# Patient Record
Sex: Female | Born: 1975 | Race: White | Hispanic: No | Marital: Married | Smoking: Former smoker
Health system: Southern US, Community
[De-identification: ages and names within clinical notes are randomized; demographics above are authoritative.]

## PROBLEM LIST (undated history)

## (undated) DIAGNOSIS — O09529 Supervision of elderly multigravida, unspecified trimester: Secondary | ICD-10-CM

## (undated) DIAGNOSIS — I499 Cardiac arrhythmia, unspecified: Secondary | ICD-10-CM

## (undated) DIAGNOSIS — IMO0002 Reserved for concepts with insufficient information to code with codable children: Secondary | ICD-10-CM

## (undated) DIAGNOSIS — F419 Anxiety disorder, unspecified: Secondary | ICD-10-CM

## (undated) DIAGNOSIS — R51 Headache: Secondary | ICD-10-CM

## (undated) HISTORY — PX: REDUCTION MAMMAPLASTY: SUR839

## (undated) HISTORY — PX: DILATION AND CURETTAGE OF UTERUS: SHX78

## (undated) HISTORY — PX: KNEE SURGERY: SHX244

## (undated) HISTORY — PX: WISDOM TOOTH EXTRACTION: SHX21

## (undated) HISTORY — PX: AUGMENTATION MAMMAPLASTY: SUR837

## (undated) HISTORY — DX: Reserved for concepts with insufficient information to code with codable children: IMO0002

## (undated) HISTORY — DX: Anxiety disorder, unspecified: F41.9

## (undated) HISTORY — DX: Headache: R51

## (undated) HISTORY — DX: Supervision of elderly multigravida, unspecified trimester: O09.529

## (undated) HISTORY — DX: Cardiac arrhythmia, unspecified: I49.9

---

## 2011-10-21 NOTE — L&D Delivery Note (Signed)
Delivery Note Pt reached complete dilation and pushed great and at 1:37 PM a healthy female was delivered via  (Presentation: LOA).  APGAR: 8,9 weight pending.   Placenta status:delivered spontaneously, fundal check confirmed no fragments, .  Cord:  with the following complications: none.    Anesthesia:  epidural Episiotomy: none Lacerations: first degree Suture Repair: 3.0 vicryl rapide Est. Blood Loss (mL): 350cc  Mom to postpartum.  Baby to stay in room with mother.  Sarah Lucas 06/09/2012, 1:58 PM

## 2011-11-24 LAB — OB RESULTS CONSOLE ANTIBODY SCREEN: Antibody Screen: NEGATIVE

## 2011-11-24 LAB — OB RESULTS CONSOLE ABO/RH

## 2011-11-24 LAB — OB RESULTS CONSOLE GC/CHLAMYDIA
Chlamydia: NEGATIVE
Gonorrhea: NEGATIVE

## 2012-05-17 LAB — OB RESULTS CONSOLE GBS: GBS: NEGATIVE

## 2012-05-28 ENCOUNTER — Encounter (HOSPITAL_COMMUNITY): Payer: Self-pay | Admitting: *Deleted

## 2012-05-28 ENCOUNTER — Telehealth (HOSPITAL_COMMUNITY): Payer: Self-pay | Admitting: *Deleted

## 2012-05-28 NOTE — Telephone Encounter (Signed)
Preadmission screen  

## 2012-06-08 NOTE — H&P (Signed)
Sarah Lucas is a 36 y.o. female G3P1011 at 39+ weeks (EDD 06/12/12 by LMP c/w 9 week [place]) presenting for IOL at term with favorable cervix.  Prenatal care uneventful.  H/o pp hemorrhage with last delivery.  Maternal Medical History:  Contractions: Frequency: irregular.   Perceived severity is mild.    Fetal activity: Perceived fetal activity is normal.      OB History    Grav Para Term Preterm Abortions TAB SAB Ect Mult Living   3 1 1  1 1    1     EAB x 1 1994 NSVD 38 weeks 8#9oz 2009  Past Medical History  Diagnosis Date  . Anxiety   . Degenerative disc disease   . Dysrhythmia   . Headache     migraines  . AMA (advanced maternal age) multigravida 35+    Past Surgical History  Procedure Date  . Knee surgery   . Wisdom tooth extraction   . Dilation and curettage of uterus    Family History: family history includes Cancer in her paternal grandfather and paternal grandmother; Diabetes in her mother; Diverticulitis in her mother; Heart attack in her mother and paternal grandfather; Heart disease in her mother; Hypertension in her father; and Migraines in her mother. Social History:  reports that she quit smoking about 11 years ago. She has never used smokeless tobacco. She reports that she does not drink alcohol or use illicit drugs.   Prenatal Transfer Tool  Maternal Diabetes: No Genetic Screening: Normal Maternal Ultrasounds/Referrals: Normal Fetal Ultrasounds or other Referrals:  None Maternal Substance Abuse:  No Significant Maternal Medications:  None Significant Maternal Lab Results:  None Other Comments:  None  ROS    Last menstrual period 09/06/2011. Maternal Exam:  Uterine Assessment: Contraction strength is mild.  Contraction frequency is irregular.   Abdomen: Patient reports no abdominal tenderness. Fetal presentation: vertex  Introitus: Normal vulva. Normal vagina.    Physical Exam  Constitutional: She is oriented to person, place, and time. She  appears well-developed and well-nourished.  Cardiovascular: Normal rate and regular rhythm.   Respiratory: Effort normal and breath sounds normal.  GI: Soft. Bowel sounds are normal.  Genitourinary: Vagina normal and uterus normal.  Neurological: She is alert and oriented to person, place, and time.  Psychiatric: She has a normal mood and affect. Her behavior is normal.    Prenatal labs: ABO, Rh: O/Positive/-- (02/04 0000) Antibody: Negative (02/04 0000) Rubella: Immune (02/04 0000) RPR: Nonreactive (02/04 0000)  HBsAg: Negative (02/04 0000)  HIV: Non-reactive (02/04 0000)  GBS: Negative (07/29 0000)  One hour GTT 79 First trimester screen WNL AFP WNL Assessment/Plan: IOL at term, plan AROM and pitocin  [name] W 06/08/2012, 5:42 PM

## 2012-06-09 ENCOUNTER — Encounter (HOSPITAL_COMMUNITY): Payer: Self-pay

## 2012-06-09 ENCOUNTER — Encounter (HOSPITAL_COMMUNITY): Payer: Self-pay | Admitting: Anesthesiology

## 2012-06-09 ENCOUNTER — Inpatient Hospital Stay (HOSPITAL_COMMUNITY): Payer: BC Managed Care – PPO | Admitting: Anesthesiology

## 2012-06-09 ENCOUNTER — Inpatient Hospital Stay (HOSPITAL_COMMUNITY)
Admission: RE | Admit: 2012-06-09 | Discharge: 2012-06-11 | DRG: 373 | Disposition: A | Discharge status: Home / Self Care | Payer: BC Managed Care – PPO | Source: Ambulatory Visit | Attending: Obstetrics and Gynecology | Admitting: Obstetrics and Gynecology

## 2012-06-09 DIAGNOSIS — O09529 Supervision of elderly multigravida, unspecified trimester: Secondary | ICD-10-CM | POA: Diagnosis present

## 2012-06-09 LAB — CBC
HCT: 39.3 % (ref 36.0–46.0)
MCV: 94.2 fL (ref 78.0–100.0)
RBC: 4.17 MIL/uL (ref 3.87–5.11)
RDW: 13.9 % (ref 11.5–15.5)
WBC: 8.7 10*3/uL (ref 4.0–10.5)

## 2012-06-09 LAB — PREPARE RBC (CROSSMATCH)

## 2012-06-09 LAB — ABO/RH: ABO/RH(D): O POS

## 2012-06-09 MED ORDER — PHENYLEPHRINE 40 MCG/ML (10ML) SYRINGE FOR IV PUSH (FOR BLOOD PRESSURE SUPPORT)
80.0000 ug | PREFILLED_SYRINGE | INTRAVENOUS | Status: DC | PRN
  Filled 2012-06-09: qty 5

## 2012-06-09 MED ORDER — ACETAMINOPHEN 325 MG PO TABS: 650.0000 mg | ORAL_TABLET | ORAL | Status: DC | PRN

## 2012-06-09 MED ORDER — PHENYLEPHRINE 40 MCG/ML (10ML) SYRINGE FOR IV PUSH (FOR BLOOD PRESSURE SUPPORT): 80.0000 ug | PREFILLED_SYRINGE | INTRAVENOUS | Status: DC | PRN

## 2012-06-09 MED ORDER — FLEET ENEMA 7-19 GM/118ML RE ENEM: 1.0000 | ENEMA | RECTAL | Status: DC | PRN

## 2012-06-09 MED ORDER — ONDANSETRON HCL 4 MG PO TABS: 4.0000 mg | ORAL_TABLET | ORAL | Status: DC | PRN

## 2012-06-09 MED ORDER — PRENATAL MULTIVITAMIN CH
1.0000 | ORAL_TABLET | Freq: Every day | ORAL | Status: DC
  Administered 2012-06-10 – 2012-06-11 (×2): 1 via ORAL
  Filled 2012-06-09 (×2): qty 1

## 2012-06-09 MED ORDER — CITRIC ACID-SODIUM CITRATE 334-500 MG/5ML PO SOLN: 30.0000 mL | ORAL | Status: DC | PRN

## 2012-06-09 MED ORDER — ZOLPIDEM TARTRATE 5 MG PO TABS: 5.0000 mg | ORAL_TABLET | Freq: Every evening | ORAL | Status: DC | PRN

## 2012-06-09 MED ORDER — SENNOSIDES-DOCUSATE SODIUM 8.6-50 MG PO TABS
2.0000 | ORAL_TABLET | Freq: Every day | ORAL | Status: DC
  Administered 2012-06-09 – 2012-06-10 (×2): 2 via ORAL

## 2012-06-09 MED ORDER — LACTATED RINGERS IV SOLN: 500.0000 mL | Freq: Once | INTRAVENOUS | Status: DC

## 2012-06-09 MED ORDER — DIPHENHYDRAMINE HCL 50 MG/ML IJ SOLN: 12.5000 mg | INTRAMUSCULAR | Status: DC | PRN

## 2012-06-09 MED ORDER — OXYCODONE-ACETAMINOPHEN 5-325 MG PO TABS
1.0000 | ORAL_TABLET | ORAL | Status: DC | PRN
  Administered 2012-06-10 (×3): 1 via ORAL
  Filled 2012-06-09 (×3): qty 1

## 2012-06-09 MED ORDER — TERBUTALINE SULFATE 1 MG/ML IJ SOLN: 0.2500 mg | Freq: Once | INTRAMUSCULAR | Status: DC | PRN

## 2012-06-09 MED ORDER — LIDOCAINE HCL (PF) 1 % IJ SOLN
30.0000 mL | INTRAMUSCULAR | Status: DC | PRN
  Filled 2012-06-09: qty 30

## 2012-06-09 MED ORDER — EPHEDRINE 5 MG/ML INJ: 10.0000 mg | INTRAVENOUS | Status: DC | PRN

## 2012-06-09 MED ORDER — EPHEDRINE 5 MG/ML INJ
10.0000 mg | INTRAVENOUS | Status: DC | PRN
  Filled 2012-06-09: qty 4

## 2012-06-09 MED ORDER — OXYTOCIN 40 UNITS IN LACTATED RINGERS INFUSION - SIMPLE MED: 62.5000 mL/h | Freq: Once | INTRAVENOUS | Status: DC

## 2012-06-09 MED ORDER — DIPHENHYDRAMINE HCL 25 MG PO CAPS: 25.0000 mg | ORAL_CAPSULE | Freq: Four times a day (QID) | ORAL | Status: DC | PRN

## 2012-06-09 MED ORDER — ONDANSETRON HCL 4 MG/2ML IJ SOLN: 4.0000 mg | Freq: Four times a day (QID) | INTRAMUSCULAR | Status: DC | PRN

## 2012-06-09 MED ORDER — OXYTOCIN 40 UNITS IN LACTATED RINGERS INFUSION - SIMPLE MED
1.0000 m[IU]/min | INTRAVENOUS | Status: DC
  Administered 2012-06-09: 2 m[IU]/min via INTRAVENOUS
  Filled 2012-06-09: qty 1000

## 2012-06-09 MED ORDER — IBUPROFEN 600 MG PO TABS
600.0000 mg | ORAL_TABLET | Freq: Four times a day (QID) | ORAL | Status: DC
  Administered 2012-06-09 – 2012-06-11 (×7): 600 mg via ORAL
  Filled 2012-06-09 (×7): qty 1

## 2012-06-09 MED ORDER — BENZOCAINE-MENTHOL 20-0.5 % EX AERO
1.0000 "application " | INHALATION_SPRAY | CUTANEOUS | Status: DC | PRN
  Administered 2012-06-09: 1 via TOPICAL
  Filled 2012-06-09: qty 56

## 2012-06-09 MED ORDER — FENTANYL 2.5 MCG/ML BUPIVACAINE 1/10 % EPIDURAL INFUSION (WH - ANES)
14.0000 mL/h | INTRAMUSCULAR | Status: DC
  Administered 2012-06-09: 14 mL/h via EPIDURAL
  Filled 2012-06-09 (×2): qty 60

## 2012-06-09 MED ORDER — SODIUM BICARBONATE 8.4 % IV SOLN
INTRAVENOUS | Status: DC | PRN
  Administered 2012-06-09: 5 mL via EPIDURAL

## 2012-06-09 MED ORDER — OXYTOCIN BOLUS FROM INFUSION
250.0000 mL | Freq: Once | INTRAVENOUS | Status: DC
  Filled 2012-06-09: qty 500

## 2012-06-09 MED ORDER — IBUPROFEN 600 MG PO TABS: 600.0000 mg | ORAL_TABLET | Freq: Four times a day (QID) | ORAL | Status: DC | PRN

## 2012-06-09 MED ORDER — DIBUCAINE 1 % RE OINT: 1.0000 "application " | TOPICAL_OINTMENT | RECTAL | Status: DC | PRN

## 2012-06-09 MED ORDER — FENTANYL 2.5 MCG/ML BUPIVACAINE 1/10 % EPIDURAL INFUSION (WH - ANES)
INTRAMUSCULAR | Status: DC | PRN
  Administered 2012-06-09: 15 mL/h via EPIDURAL

## 2012-06-09 MED ORDER — LANOLIN HYDROUS EX OINT: TOPICAL_OINTMENT | CUTANEOUS | Status: DC | PRN

## 2012-06-09 MED ORDER — LACTATED RINGERS IV SOLN
INTRAVENOUS | Status: DC
  Administered 2012-06-09: 12:00:00 via INTRAVENOUS

## 2012-06-09 MED ORDER — LACTATED RINGERS IV SOLN
500.0000 mL | INTRAVENOUS | Status: DC | PRN
  Administered 2012-06-09: 500 mL via INTRAVENOUS

## 2012-06-09 MED ORDER — TETANUS-DIPHTH-ACELL PERTUSSIS 5-2.5-18.5 LF-MCG/0.5 IM SUSP: 0.5000 mL | Freq: Once | INTRAMUSCULAR | Status: DC

## 2012-06-09 MED ORDER — OXYCODONE-ACETAMINOPHEN 5-325 MG PO TABS: 1.0000 | ORAL_TABLET | ORAL | Status: DC | PRN

## 2012-06-09 MED ORDER — ONDANSETRON HCL 4 MG/2ML IJ SOLN: 4.0000 mg | INTRAMUSCULAR | Status: DC | PRN

## 2012-06-09 MED ORDER — WITCH HAZEL-GLYCERIN EX PADS: 1.0000 "application " | MEDICATED_PAD | CUTANEOUS | Status: DC | PRN

## 2012-06-09 MED ORDER — SIMETHICONE 80 MG PO CHEW: 80.0000 mg | CHEWABLE_TABLET | ORAL | Status: DC | PRN

## 2012-06-09 NOTE — Anesthesia Procedure Notes (Addendum)
Epidural Patient location during procedure: OB  Preanesthetic Checklist Completed: patient identified, site marked, surgical consent, pre-op evaluation, timeout performed, IV checked, risks and benefits discussed and monitors and equipment checked  Epidural Patient position: sitting Prep: site prepped and draped and DuraPrep Patient monitoring: continuous pulse ox and blood pressure Approach: midline Injection technique: LOR air  Needle:  Needle type: Tuohy  Needle gauge: 17 G Needle length: 9 cm Needle insertion depth: 5 cm cm Catheter type: closed end flexible Catheter size: 19 Gauge Catheter at skin depth: 10 cm Test dose: negative  Assessment Events: blood not aspirated, injection not painful, no injection resistance, negative IV test and no paresthesia  Additional Notes Dosing of Epidural:  1st dose, through needle ............................................. epi 1:200K + Xylocaine 40 mg  2nd dose, through catheter, after waiting 3 minutes.....epi 1:200K + Xylocaine 60 mg  3rd dose, through catheter after waiting 3 minutes .............................Marcaine   5mg   ( mg Marcaine are expressed as equivilent  cc's medication removed from the 0.1%Bupiv / fentanyl syringe from L&D pump)  ( 2% Xylo charted as a single dose in Epic Meds for ease of charting; actual dosing was fractionated as above, for saftey's sake)  As each dose occurred, patient was free of IV sx; and patient exhibited no evidence of SA injection.  Patient is more comfortable after epidural dosed. Please see RN's note for documentation of vital signs,and FHR which are stable.  Patient reminded not to try to ambulate with numb legs, and that an RN must be present when she attempts to get up.       

## 2012-06-09 NOTE — Progress Notes (Signed)
   Subjective: Comfortable with epidural  Objective: BP 107/58  Pulse 84  Temp 98.3 F (36.8 C) (Oral)  Resp 20  Ht 6\' 1"  (1.854 m)  Wt 90.719 kg (200 lb)  BMI 26.39 kg/m2  SpO2 94%  LMP 09/06/2011   Total I/O In: -  Out: 700 [Urine:700]  FHT:  FHR: 140 bpm, variability: moderate,  accelerations:  Present,  decelerations:  Absent UC:   regular, every 3-5 minutes SVE:   C/7/-1 OP  Labs: Lab Results  Component Value Date   WBC 8.7 06/09/2012   HGB 13.2 06/09/2012   HCT 39.3 06/09/2012   MCV 94.2 06/09/2012   PLT 174 06/09/2012    Assessment / Plan: Try sims position and follow progress Estefanny Moler W 06/09/2012, 1:10 PM

## 2012-06-09 NOTE — Progress Notes (Signed)
Patient ID: Sarah Lucas, female   DOB: 03-15-1976, 36 y.o.   MRN: [id number] Pt doing well, feeling ctx 5/10 On pitocin FHR reactive Cervix 60-70/3/-1  AROM clear Pt would like epidural, will go ahead with that.

## 2012-06-09 NOTE — Anesthesia Preprocedure Evaluation (Signed)

## 2012-06-10 LAB — CBC
HCT: 35.7 % — ABNORMAL LOW (ref 36.0–46.0)
Hemoglobin: 12 g/dL (ref 12.0–15.0)
MCHC: 33.6 g/dL (ref 30.0–36.0)
MCV: 94.7 fL (ref 78.0–100.0)

## 2012-06-10 NOTE — Progress Notes (Signed)
Post Partum Day 1 Subjective: no complaints and tolerating PO  Objective: Blood pressure 116/79, pulse 75, temperature 97.9 F (36.6 C), temperature source Oral, resp. rate 18, height 6\' 1"  (1.854 m), weight 90.719 kg (200 lb), last menstrual period 09/06/2011, SpO2 94.00%, unknown if currently breastfeeding.  Physical Exam:  General: alert and cooperative Lochia: appropriate Uterine Fundus: firm    Basename 06/10/12 0540 06/09/12 0930  HGB 12.0 13.2  HCT 35.7* 39.3    Assessment/Plan: Plan for discharge tomorrow   LOS: 1 day   Sarah Lucas W 06/10/2012, 8:35 AM

## 2012-06-10 NOTE — Anesthesia Postprocedure Evaluation (Signed)
  Anesthesia Post Note  Patient: Sarah Lucas  Procedure(s) Performed: * No procedures listed *  Anesthesia type: Epidural  Patient location: Mother/Baby  Post pain: Pain level controlled  Post assessment: Post-op Vital signs reviewed  Last Vitals:  Filed Vitals:   06/10/12 0550  BP: 116/79  Pulse: 75  Temp: 36.6 C  Resp: 18    Post vital signs: Reviewed  Level of consciousness:alert  Complications: No apparent anesthesia complications

## 2012-06-11 LAB — TYPE AND SCREEN
Antibody Screen: NEGATIVE
Unit division: 0

## 2012-06-11 MED ORDER — IBUPROFEN 600 MG PO TABS: 600.0000 mg | ORAL_TABLET | Freq: Four times a day (QID) | ORAL | Status: AC

## 2012-06-11 MED ORDER — OXYCODONE-ACETAMINOPHEN 5-325 MG PO TABS: 1.0000 | ORAL_TABLET | ORAL | Status: AC | PRN

## 2012-06-11 NOTE — Progress Notes (Signed)
Post Partum Day 2 Subjective: no complaints and up ad lib  Objective: Blood pressure 111/68, pulse 77, temperature 98.4 F (36.9 C), temperature source Oral, resp. rate 18, height 6\' 1"  (1.854 m), weight 90.719 kg (200 lb), last menstrual period 09/06/2011, SpO2 99.00%, unknown if currently breastfeeding.  Physical Exam:  General: alert and cooperative Lochia: appropriate Uterine Fundus: firm    Basename 06/10/12 0540 06/09/12 0930  HGB 12.0 13.2  HCT 35.7* 39.3    Assessment/Plan: Discharge home Motrin and percocet F/u 6 weeks   LOS: 2 days   [name] W 06/11/2012, 7:57 AM

## 2012-06-11 NOTE — Discharge Summary (Signed)
Obstetric Discharge Summary Reason for Admission: induction of labor Prenatal Procedures: none Intrapartum Procedures: spontaneous vaginal delivery Postpartum Procedures: none Complications-Operative and Postpartum: first degree perineal laceration Hemoglobin  Date Value Range Status  06/10/2012 12.0  12.0 - 15.0 g/dL Final     HCT  Date Value Range Status  06/10/2012 35.7* 36.0 - 46.0 % Final    Physical Exam:  General: alert and cooperative Lochia: appropriate Uterine Fundus: firm  Discharge Diagnoses: Term Pregnancy-delivered  Discharge Information: Date: 06/11/2012 Activity: pelvic rest Diet: routine Medications: Ibuprofen and Percocet Condition: improved Instructions: refer to practice specific booklet Discharge to: home   Newborn Data: Live born female  Birth Weight: 8 lb 11 oz (3941 g) APGAR: 9, 9  Home with mother.  Sarah Lucas 06/11/2012, 7:59 AM

## 2012-06-12 ENCOUNTER — Inpatient Hospital Stay (HOSPITAL_COMMUNITY): Admission: AD | Admit: 2012-06-12 | Payer: Self-pay | Source: Ambulatory Visit | Admitting: Obstetrics and Gynecology

## 2012-11-17 ENCOUNTER — Other Ambulatory Visit (HOSPITAL_COMMUNITY): Payer: Self-pay | Admitting: Obstetrics and Gynecology

## 2012-11-17 DIAGNOSIS — N971 Female infertility of tubal origin: Secondary | ICD-10-CM

## 2012-11-24 ENCOUNTER — Ambulatory Visit (HOSPITAL_COMMUNITY)
Admission: RE | Admit: 2012-11-24 | Discharge: 2012-11-24 | Disposition: A | Discharge status: Home / Self Care | Payer: BC Managed Care – PPO | Source: Ambulatory Visit | Attending: Obstetrics and Gynecology | Admitting: Obstetrics and Gynecology

## 2012-11-24 DIAGNOSIS — N971 Female infertility of tubal origin: Secondary | ICD-10-CM

## 2012-11-24 DIAGNOSIS — Z3049 Encounter for surveillance of other contraceptives: Secondary | ICD-10-CM | POA: Insufficient documentation

## 2012-11-24 MED ORDER — IOHEXOL 300 MG/ML  SOLN
20.0000 mL | Freq: Once | INTRAMUSCULAR | Status: AC | PRN
  Administered 2012-11-24: 7 mL

## 2014-08-21 ENCOUNTER — Encounter (HOSPITAL_COMMUNITY): Payer: Self-pay

## 2014-09-25 ENCOUNTER — Ambulatory Visit (INDEPENDENT_AMBULATORY_CARE_PROVIDER_SITE_OTHER): Payer: No Typology Code available for payment source

## 2014-09-25 ENCOUNTER — Encounter: Payer: Self-pay | Admitting: Podiatry

## 2014-09-25 ENCOUNTER — Ambulatory Visit (INDEPENDENT_AMBULATORY_CARE_PROVIDER_SITE_OTHER): Payer: No Typology Code available for payment source | Admitting: Podiatry

## 2014-09-25 VITALS — BP 106/62 | HR 71 | Resp 16

## 2014-09-25 DIAGNOSIS — G5762 Lesion of plantar nerve, left lower limb: Secondary | ICD-10-CM

## 2014-09-25 DIAGNOSIS — M2010 Hallux valgus (acquired), unspecified foot: Secondary | ICD-10-CM

## 2014-09-25 DIAGNOSIS — G5782 Other specified mononeuropathies of left lower limb: Secondary | ICD-10-CM

## 2014-09-25 DIAGNOSIS — M779 Enthesopathy, unspecified: Secondary | ICD-10-CM

## 2014-09-25 NOTE — Progress Notes (Signed)
Subjective:     Patient ID: Sarah FlemingsMargaret Lucas, female   DOB: 03/28/1976, 38 y.o.   MRN: [id number]  HPI patient presents stating I got bunion deformities on both my feet and I have an awful shooting burning pain in the third interspace of my left foot that has been going on for a while and making it hard for me to walk or wear shoe gear the bunions get sore   Review of Systems  All other systems reviewed and are negative.      Objective:   Physical Exam  Constitutional: She is oriented to person, place, and time.  Cardiovascular: Intact distal pulses.   Musculoskeletal: Normal range of motion.  Neurological: She is oriented to person, place, and time.  Skin: Skin is warm.  Nursing note and vitals reviewed.  neurovascular status intact with muscle strength adequate and range of motion of the subtalar midtarsal joint within normal limits. Patient is noted to have moderate structural bunion deformity around the first metatarsal of both feet with redness noted upon palpation and is noted to have shooting pain in the third interspace left foot with radiating discomfort into the adjacent digits. Patient is well oriented 3 has good digital perfusion and no equinus condition noted     Assessment:     Structural HAV deformity bilateral that is more apparent clinically than radiographically and probable neuroma symptomatology left    Plan:     H&P and x-rays reviewed with patient. She would do very well with structural bunion correction consisting of Austin type procedure but today I'm focusing on the nerves I we can decide what we'll be best for her long-term. I did do a neuro lysis injection third interspace 1.5 mL of purified alcohol solution and Marcaine which she tolerated well and we will see her back in 1 week to discuss the results of this treatment and what we'll be best for her long-term

## 2014-09-25 NOTE — Patient Instructions (Addendum)
Bunion (Hallux Valgus) A bony bump (protrusion) on the inside of the foot, at the base of the first toe, is called a bunion (hallux valgus). A bunion causes the first toe to angle toward the other toes. SYMPTOMS   A bony bump on the inside of the foot, causing an outward turning of the first toe. It may also overlap the second toe.  Thickening of the skin (callus) over the bony bump.  Fluid buildup under the callus. Fluid may become red, tender, and swollen (inflamed) with constant irritation or pressure.  Foot pain and stiffness. CAUSES  Many causes exist, including:  Inherited from your family (genetics).  Injury (trauma) forcing the first toe into a position in which it overlaps other toes.  Bunions are also associated with wearing shoes that have a narrow toe box (pointy shoes). RISK INCREASES WITH:  Family history of foot abnormalities, especially bunions.  Arthritis.  Narrow shoes, especially high heels. PREVENTION  Wear shoes with a wide toe box.  Avoid shoes with high heels.  Wear a small pad between the big toe and second toe.  Maintain proper conditioning:  Foot and ankle flexibility.  Muscle strength and endurance. PROGNOSIS  With proper treatment, bunions can typically be cured. Occasionally, surgery is required.  RELATED COMPLICATIONS   Infection of the bunion.  Arthritis of the first toe.  Risks of surgery, including infection, bleeding, injury to nerves (numb toe), recurrent bunion, overcorrection (toe points inward), arthritis of the big toe, big toe pointing upward, and bone not healing. TREATMENT  Treatment first consists of stopping the activities that aggravate the pain, taking pain medicines, and icing to reduce inflammation and pain. Wear shoes with a wide toe box. Shoes can be modified by a shoe repair person to relieve pressure on the bunion, especially if you cannot find shoes with a wide enough toe box. You may also place a pad with the  center cut out in your shoe, to reduce pressure on the bunion. Sometimes, an arch support (orthotic) may reduce pressure on the bunion and alleviate the symptoms. Stretching and strengthening exercises for the muscles of the foot may be useful. You may choose to wear a brace or pad at night to hold the big toe away from the second toe. If non-surgical treatments are not successful, surgery may be needed. Surgery involves removing the overgrown tissue and correcting the position of the first toe, by realigning the bones. Bunion surgery is typically performed on an outpatient basis, meaning you can go home the same day as surgery. The surgery may involve cutting the mid portion of the bone of the first toe, or just cutting and repairing (reconstructing) the ligaments and soft tissues around the first toe.  MEDICATION   If pain medicine is needed, nonsteroidal anti-inflammatory medicines, such as aspirin and ibuprofen, or other minor pain relievers, such as acetaminophen, are often recommended.  Do not take pain medicine for 7 days before surgery.  Prescription pain relievers are usually only prescribed after surgery. Use only as directed and only as much as you need.  Ointments applied to the skin may be helpful. HEAT AND COLD  Cold treatment (icing) relieves pain and reduces inflammation. Cold treatment should be applied for 10 to 15 minutes every 2 to 3 hours for inflammation and pain and immediately after any activity that aggravates your symptoms. Use ice packs or an ice massage.  Heat treatment may be used prior to performing the stretching and strengthening activities prescribed by your   caregiver, physical therapist, or athletic trainer. Use a heat pack or a warm soak. SEEK MEDICAL CARE IF:   Symptoms get worse or do not improve in 2 weeks, despite treatment.  After surgery, you develop fever, increasing pain, redness, swelling, drainage of fluids, bleeding, or increasing warmth around the  surgical area.  New, unexplained symptoms develop. (Drugs used in treatment may produce side effects.) Document Released: 10/06/2005 Document Revised: 12/29/2011 Document Reviewed: 01/18/2009 [facility] Patient Information 2015 [employer], [employer]. This information is not intended to replace advice given to you by your health care provider. Make sure you discuss any questions you have with your health care provider.    Morton's Neuroma  (Interdigital Plantar Neuroma) Morton's neuroma is a condition of the nervous system that results in pain or loss of feeling in the toes. The disease is caused by the bones of the foot squeezing the nerve that runs between two toes (interdigital nerve). The third and fourth toes are most likely to be affected by this disease. SYMPTOMS   Tingling, numbness, burning, or electric shocks in the front of the foot, often involving the third and fourth toes, although it may involve any other pair of toes.  Pain and tenderness in the front of the foot, that gets worse when walking.  Pain that gets worse when pressure is applied to the foot (wearing shoes).  Severe pain in the front of the foot, when standing on the front of the foot (on tiptoes), such as with running, jumping, pivoting, or dancing. CAUSES  Morton's neuroma is caused by swelling of the nerve between two toes. This swelling causes the nerve to be pinched between the bones of the foot. RISK INCREASES WITH:  Recurring foot or ankle injuries.  Poor fitting or worn shoes, with minimal padding and shock absorbers.  Loose ligaments of the foot, causing thickening of the nerve.  Poor foot strength and flexibility. PREVENTION  Warm up and stretch properly before activity.  Maintain physical fitness:  Foot and ankle flexibility.  Muscle strength and endurance.  Cardiovascular fitness.  Wear properly fitted and padded shoes.  Wear arch supports (orthotics), when needed. PROGNOSIS  If treated  properly, Morton's neuroma can usually be cured with non-surgical treatment. For certain cases, surgery may be needed. RELATED COMPLICATIONS  Permanent numbness and pain in the foot.  Inability to participate in athletics, because of pain. TREATMENT Treatment first involves stopping any activities that make the symptoms worse. The use of ice and medicine will help reduce pain and inflammation. Wearing shoes with a wide toe box, and an orthotic arch support or metatarsal bar, may also reduce pain. Your caregiver may give you a corticosteroid injection, to further reduce inflammation. If non-surgical treatment is unsuccessful, surgery may be needed. Surgery to fix Morton's neuroma is often performed as an outpatient procedure, meaning you can go home the same day as the surgery. The procedure involves removing the source of pressure on the nerve. If it is necessary to remove the nerve, you can expect persistent numbness. MEDICATION  If pain medicine is needed, nonsteroidal anti-inflammatory medicines (aspirin and ibuprofen), or other minor pain relievers (acetaminophen), are often advised.  Do not take pain medicine for 7 days before surgery.  Prescription pain relievers are usually prescribed only after surgery. Use only as directed and only as much as you need.  Corticosteroid injections are used in extreme cases, to reduce inflammation. These injections should be done only if necessary, because they may be given only a limited  number of times. HEAT AND COLD  Cold treatment (icing) should be applied for 10 to 15 minutes every 2 to 3 hours for inflammation and pain, and immediately after activity that aggravates your symptoms. Use ice packs or an ice massage.  Heat treatment may be used before performing stretching and strengthening activities prescribed by your caregiver, physical therapist, or athletic trainer. Use a heat pack or a warm water soak. SEEK MEDICAL CARE IF:   Symptoms get worse  or do not improve in 2 weeks, despite treatment.  After surgery you develop increasing pain, swelling, redness, increased warmth, bleeding, drainage of fluids, or fever.  New, unexplained symptoms develop. (Drugs used in treatment may produce side effects.) Document Released: 08/13/2005 Document Revised: 12/29/2011 Document Reviewed: 01/18/2009 [facility] Patient Information 2015 [employer], [employer]. This information is not intended to replace advice given to you by your health care provider. Make sure you discuss any questions you have with your health care provider.

## 2014-09-25 NOTE — Progress Notes (Signed)
   Subjective:    Patient ID: Sarah FlemingsMargaret Leventhal, female    DOB: 11/06/1975, 38 y.o.   MRN: [id number]  HPI Comments: "I have bunions and the bottom of this left foot is hurting"  Patient c/o aching, sharp 1st MPJ bilateral and plantar forefoot left for few months. The area gets worse with walking. Shoe are uncomfortable. No home treatment.  Foot Pain Associated symptoms include arthralgias.      Review of Systems  Musculoskeletal: Positive for back pain, arthralgias and gait problem.  All other systems reviewed and are negative.      Objective:   Physical Exam        Assessment & Plan:

## 2014-10-02 ENCOUNTER — Ambulatory Visit (INDEPENDENT_AMBULATORY_CARE_PROVIDER_SITE_OTHER): Payer: No Typology Code available for payment source | Admitting: Podiatry

## 2014-10-02 ENCOUNTER — Encounter: Payer: Self-pay | Admitting: Podiatry

## 2014-10-02 VITALS — BP 101/71 | HR 78 | Resp 18

## 2014-10-02 DIAGNOSIS — G5782 Other specified mononeuropathies of left lower limb: Secondary | ICD-10-CM

## 2014-10-02 DIAGNOSIS — M2010 Hallux valgus (acquired), unspecified foot: Secondary | ICD-10-CM

## 2014-10-02 DIAGNOSIS — G5762 Lesion of plantar nerve, left lower limb: Secondary | ICD-10-CM

## 2014-10-02 NOTE — Progress Notes (Signed)
Subjective:     Patient ID: Sarah FlemingsMargaret Raupp, female   DOB: 03/24/1976, 38 y.o.   MRN: [id number]  HPI patient presents stating I had tremendous relief between my third and fourth toes for the first day and then the pain returned and it still been shooting and my bunion continues to bother me on both my feet   Review of Systems     Objective:   Physical Exam Neurovascular status intact with muscle strength adequate and range of motion within normal limits. 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 I do think given the significant nature of her deformity and pain that she should consider surgery and I reviewed this versus consideration of neuro lysis for the nerve. She wants the bunion fixed also and it would be best to correct them both together and I reviewed that fact with her. Patient wants surgery and at this time I allowed her to read consent form for Austin-type bunionectomy left and neurectomy third interspace left going over all possible complications as outlined. Patient understands risk involved with surgery and the fact that total recovery. Can take 6 months to one year and at this time she signs consent form and is given preoperative instructions. Dispensed air fracture walker with instructions on usage and scheduled in the next couple weeks for procedure and encouraged to call us if she should have any questions prior to her surgery

## 2014-10-17 ENCOUNTER — Encounter: Payer: Self-pay | Admitting: Podiatry

## 2014-10-17 DIAGNOSIS — M2012 Hallux valgus (acquired), left foot: Secondary | ICD-10-CM

## 2014-10-17 DIAGNOSIS — G5762 Lesion of plantar nerve, left lower limb: Secondary | ICD-10-CM

## 2014-10-18 ENCOUNTER — Telehealth: Payer: Self-pay

## 2014-10-18 NOTE — Telephone Encounter (Signed)
Pt called [facility]. aDVISED TO TAKE 600MG  OF IBUPROFEN IN BETWEEN PRESCRIBED PAIN MEDICATION, ADVISED TO LOOSEN ACE WRAP AND RE-WRAP, ICE AND ELEVATE AND CALL WITH QUESTIONS OR CONCERNS

## 2014-10-19 ENCOUNTER — Telehealth: Payer: Self-pay

## 2014-10-19 MED ORDER — OXYCODONE-ACETAMINOPHEN 5-325 MG PO TABS
ORAL_TABLET | ORAL | Status: DC
Start: 2014-10-19 — End: 2016-03-12

## 2014-10-19 MED ORDER — OXYCODONE-ACETAMINOPHEN 5-325 MG PO TABS: 1.0000 | ORAL_TABLET | Freq: Three times a day (TID) | ORAL | Status: DC | PRN

## 2014-10-19 NOTE — Telephone Encounter (Signed)
DOS 10/17/2014 with Dr. Charlsie Merlesegal and the surgery foot is burning and throbbing.  Dr. Charlsie Merlesegal ordered Percocet 5/325mg  #40 one tablet every 4 -6 hours for foot pain.  I encouraged pt to remove boot and ace only and elevate foot 15 minutes then level the foot and rewrap ace looser and replace the boot.  I told pt [place]

## 2014-10-23 ENCOUNTER — Encounter: Payer: Self-pay | Admitting: Podiatry

## 2014-10-23 ENCOUNTER — Ambulatory Visit (INDEPENDENT_AMBULATORY_CARE_PROVIDER_SITE_OTHER): Payer: No Typology Code available for payment source

## 2014-10-23 ENCOUNTER — Ambulatory Visit (INDEPENDENT_AMBULATORY_CARE_PROVIDER_SITE_OTHER): Payer: No Typology Code available for payment source | Admitting: Podiatry

## 2014-10-23 VITALS — BP 118/76 | HR 87 | Resp 16

## 2014-10-23 DIAGNOSIS — M2012 Hallux valgus (acquired), left foot: Secondary | ICD-10-CM

## 2014-10-25 NOTE — Progress Notes (Signed)
Dr Charlsie Merlesegal performed a left [place], 3rd left neurectomy on 12.29.15

## 2014-10-25 NOTE — Progress Notes (Signed)
Subjective:     Patient ID: Sarah Lucas, female   DOB: 11/30/1975, 39 y.o.   MRN: [id number]  HPI patient states that she is feeling quite a bit better and is walking with minimal swelling   Review of Systems     Objective:   Physical Exam Neurovascular status intact negative Homan sign was noted and patient is found to have well-healing surgical sites left first metatarsal third interspace with wound edges well coapted and good alignment of the first MPJ with adequate range of motion for this. Postop    Assessment:     Doing well post surgery of the left foot one week    Plan:     X-rays evaluated and reapplied sterile dressing and advised on continued compression elevation. Patient will be seen back 2 weeks and was given instructions on range of motion exercises for the first MPJ and gradual increase in activity levels

## 2014-11-03 ENCOUNTER — Encounter: Payer: Self-pay | Admitting: Podiatry

## 2014-11-03 ENCOUNTER — Ambulatory Visit: Payer: No Typology Code available for payment source

## 2014-11-03 ENCOUNTER — Telehealth: Payer: Self-pay

## 2014-11-03 VITALS — BP 112/63 | HR 68 | Temp 98.2°F | Resp 16

## 2014-11-03 DIAGNOSIS — M2012 Hallux valgus (acquired), left foot: Secondary | ICD-10-CM

## 2014-11-03 MED ORDER — CEPHALEXIN 500 MG PO CAPS: 500.0000 mg | ORAL_CAPSULE | Freq: Three times a day (TID) | ORAL | Status: DC

## 2014-11-03 NOTE — Progress Notes (Signed)
Pt presents with post operative redness and pain at incision site. Noted mild redness and swelling at incision site, no drainage noted. Wound edges aligned and approximated. Pt is afebrile denies any nausea or vomiting, chills or muscle aches. Advised to elevate and rest foot, ice over sock and remain in CAM walker. Keflex was prescribed as precautionary for infection, advised to take entire dose as directed. Call or seek medical attention if area worsens in pain, if there is any drainage or increase in redness or if she experiences aches, chills or a fever.

## 2014-11-03 NOTE — Telephone Encounter (Signed)
Spoke with pt regarding post operative concerns, she stated that the incision area on her foot was reddened and tender, DOS 10/17/14 denied any drainage, advised pt to come in and have her foot evaluated, she agreed

## 2014-11-13 ENCOUNTER — Ambulatory Visit (INDEPENDENT_AMBULATORY_CARE_PROVIDER_SITE_OTHER): Payer: No Typology Code available for payment source | Admitting: Podiatry

## 2014-11-13 ENCOUNTER — Ambulatory Visit (INDEPENDENT_AMBULATORY_CARE_PROVIDER_SITE_OTHER): Payer: No Typology Code available for payment source

## 2014-11-13 DIAGNOSIS — Z9889 Other specified postprocedural states: Secondary | ICD-10-CM

## 2014-11-15 NOTE — Progress Notes (Signed)
Subjective:     Patient ID: Sarah Lucas, female   DOB: 10/13/1976, 39 y.o.   MRN: [id number]  HPI patient states I'm doing really well and walking without discomfort or swelling   Review of Systems     Objective:   Physical Exam Neurovascular status intact with right foot healing well with wound edges well coapted and excellent range of motion with 40 dorsiflexion 30 plantar flexion with no crepitus noted    Assessment:     Healing well from foot surgery right    Plan:     X-ray taken reviewed and allow patient to gradually increase activities and applied anklet for compression and continue with immobilization and elevation. Reappoint to recheck

## 2014-12-11 ENCOUNTER — Ambulatory Visit (INDEPENDENT_AMBULATORY_CARE_PROVIDER_SITE_OTHER): Payer: No Typology Code available for payment source

## 2014-12-11 ENCOUNTER — Ambulatory Visit (INDEPENDENT_AMBULATORY_CARE_PROVIDER_SITE_OTHER): Payer: No Typology Code available for payment source | Admitting: Podiatry

## 2014-12-11 DIAGNOSIS — M2012 Hallux valgus (acquired), left foot: Secondary | ICD-10-CM

## 2014-12-12 NOTE — Progress Notes (Signed)
Subjective:     Patient ID: Sarah FlemingsMargaret Bronk, female   DOB: 11/27/1975, 39 y.o.   MRN: [id number]  HPI patient presents stating I'm doing real well with my left foot with minimal discomfort and I'm very pleased and I'm able to exercise without pain or swelling   Review of Systems     Objective:   Physical Exam Neurovascular status intact with muscle strength adequate range of motion within normal limits and well-healed surgical site left first MPJ with wound edges well coapted and good range of motion    Assessment:     Doing well postop Austin osteotomy surgery left and neuroma    Plan:     Reviewed condition and discussed gradual increase in activities based on x-ray. Patient will be seen back on an as-needed basis and is discharged and less any issues should occur

## 2015-02-05 ENCOUNTER — Ambulatory Visit (INDEPENDENT_AMBULATORY_CARE_PROVIDER_SITE_OTHER): Payer: No Typology Code available for payment source

## 2015-02-05 ENCOUNTER — Ambulatory Visit (INDEPENDENT_AMBULATORY_CARE_PROVIDER_SITE_OTHER): Payer: No Typology Code available for payment source | Admitting: Podiatry

## 2015-02-05 DIAGNOSIS — Z9889 Other specified postprocedural states: Secondary | ICD-10-CM

## 2015-02-05 DIAGNOSIS — M2012 Hallux valgus (acquired), left foot: Secondary | ICD-10-CM

## 2015-02-06 NOTE — Progress Notes (Signed)
Subjective:     Patient ID: Sarah FlemingsMargaret Vencill, female   DOB: 11/11/1975, 39 y.o.   MRN: [id number]  HPI patient states that she is doing great with her surgery and wanted to resume normal activity and has just started to run   Review of Systems     Objective:   Physical Exam Her vascular status intact muscle strength adequate with range of motion of the first MPJ left 35 dorsiflexion 25 plantar flexion with no crepitus in the joint noted and good dorsi flexion of 35 plantarflexion of 25 with no muscle restriction noted    Assessment:     Doing well post Austin osteotomy and neuroma excision left    Plan:     Viewed final x-rays and allow patient to return to normal activity and still be careful with too much jumping or running on her foot. Patient will be seen back as needed

## 2016-02-27 ENCOUNTER — Encounter: Payer: 59 | Admitting: Podiatry

## 2016-03-12 ENCOUNTER — Ambulatory Visit (INDEPENDENT_AMBULATORY_CARE_PROVIDER_SITE_OTHER): Payer: 59

## 2016-03-12 ENCOUNTER — Ambulatory Visit (INDEPENDENT_AMBULATORY_CARE_PROVIDER_SITE_OTHER): Payer: 59 | Admitting: Podiatry

## 2016-03-12 ENCOUNTER — Encounter: Payer: Self-pay | Admitting: Podiatry

## 2016-03-12 VITALS — BP 105/67 | HR 75 | Resp 16

## 2016-03-12 DIAGNOSIS — M79672 Pain in left foot: Secondary | ICD-10-CM | POA: Diagnosis not present

## 2016-03-12 DIAGNOSIS — M205X1 Other deformities of toe(s) (acquired), right foot: Secondary | ICD-10-CM | POA: Diagnosis not present

## 2016-03-12 DIAGNOSIS — M79671 Pain in right foot: Secondary | ICD-10-CM

## 2016-03-12 DIAGNOSIS — M779 Enthesopathy, unspecified: Secondary | ICD-10-CM | POA: Diagnosis not present

## 2016-03-12 MED ORDER — TRIAMCINOLONE ACETONIDE 10 MG/ML IJ SUSP
10.0000 mg | Freq: Once | INTRAMUSCULAR | Status: AC
  Administered 2016-03-12: 10 mg

## 2016-03-12 NOTE — Progress Notes (Signed)
Subjective:     Patient ID: Sarah FlemingsMargaret Lucas, female   DOB: 03/17/1976, 40 y.o.   MRN: [id number]  HPI patient presents stating I have started to develop a lot of pain in my big toe joint right and my left one is doing really well with surgery with occasional discomfort   Review of Systems     Objective:   Physical Exam Neurovascular status intact muscle strength adequate with mild reduction of motion first MPJ right with hyperostosis medial side and pain across the dorsal surface and on the medial lateral portion of the joint surface. 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 At this point I went ahead and carefully injected the first MPJ right 3 mg Kenalog 5 mill grams Xylocaine and reviewed x-rays and the fact this will need to be corrected with a possible biplanar-type osteotomy. I went ahead and scanned for a custom orthotic with a Morton's extension of the first MPJ right and we will start with that and she cannot do surgery until winter  X-ray report indicates that there is some changes around the first MPJ right with narrowing of the medial joint surface and structural bunion deformity with elevation of the intermetatarsal angle of approximate 14

## 2016-03-20 NOTE — Progress Notes (Signed)
This encounter was created in error - please disregard.

## 2016-07-30 ENCOUNTER — Ambulatory Visit (INDEPENDENT_AMBULATORY_CARE_PROVIDER_SITE_OTHER): Payer: 59

## 2016-07-30 ENCOUNTER — Ambulatory Visit (INDEPENDENT_AMBULATORY_CARE_PROVIDER_SITE_OTHER): Payer: 59 | Admitting: Podiatry

## 2016-07-30 ENCOUNTER — Encounter: Payer: Self-pay | Admitting: Podiatry

## 2016-07-30 VITALS — BP 115/77 | HR 71 | Resp 16

## 2016-07-30 DIAGNOSIS — M79671 Pain in right foot: Secondary | ICD-10-CM

## 2016-07-30 DIAGNOSIS — M79672 Pain in left foot: Secondary | ICD-10-CM

## 2016-07-30 DIAGNOSIS — M722 Plantar fascial fibromatosis: Secondary | ICD-10-CM | POA: Diagnosis not present

## 2016-07-30 DIAGNOSIS — M779 Enthesopathy, unspecified: Secondary | ICD-10-CM

## 2016-07-30 MED ORDER — TRIAMCINOLONE ACETONIDE 10 MG/ML IJ SUSP
10.0000 mg | Freq: Once | INTRAMUSCULAR | Status: AC
  Administered 2016-07-30: 10 mg

## 2016-07-30 NOTE — Progress Notes (Signed)
Subjective:     Patient ID: Sarah FlemingsMargaret Lucas, female   DOB: 03/13/1976, 40 y.o.   MRN: [id number]  HPI patient states I'm getting pain around my big toe joint right and I know I need surgery but I need to wait until the winter and I'm getting a lot of pain in my left heel and I'm very active   Review of Systems     Objective:   Physical Exam Neurovascular status intact with discomfort around the right first MPJ right with fluid buildup around the joint surface and on the left heel discomfort in the medial band with fluid buildup noted    Assessment:     Inflammatory hallux limitus deformity right along with plantar fasciitis left and pain    Plan:     H&P and both conditions discussed and went ahead did careful injection around the first MPJ right 3 mg Kenalog 5 mill grams Xylocaine and injected the left plantar fashion 3 mg Kenalog 5 mg Xylocaine dispensed fascial brace with instructions on usage. Reappoint 2 weeks to check left heel  X-ray report indicated spur formation plantar heel with structural bunion deformity right with narrowing of the joint surface

## 2016-07-30 NOTE — Patient Instructions (Signed)

## 2016-08-13 ENCOUNTER — Ambulatory Visit: Payer: 59 | Admitting: Podiatry

## 2016-08-25 ENCOUNTER — Ambulatory Visit (INDEPENDENT_AMBULATORY_CARE_PROVIDER_SITE_OTHER): Payer: 59 | Admitting: Podiatry

## 2016-08-25 DIAGNOSIS — M722 Plantar fascial fibromatosis: Secondary | ICD-10-CM | POA: Diagnosis not present

## 2016-08-25 MED ORDER — TRIAMCINOLONE ACETONIDE 10 MG/ML IJ SUSP
10.0000 mg | Freq: Once | INTRAMUSCULAR | Status: AC
  Administered 2016-08-25: 10 mg

## 2016-08-25 NOTE — Progress Notes (Signed)
Subjective:     Patient ID: Sarah FlemingsMargaret Lucas, female   DOB: 12/17/1975, 40 y.o.   MRN: [id number]  HPI patient presents stating the inside of my heel doing pretty good but I'm getting pain on the bottom outside of the heel which may be from walking differently   Review of Systems     Objective:   Physical Exam Neurovascular status intact muscle strength adequate discomfort in the plantar aspect left heel lateral side with inflammation and fluid occurring around the insertion    Assessment:     Plantar fasciitis which is migrated to the lateral side of the plantar fascia    Plan:     Injected the lateral fascia 3 mg Kenalog 5 mg Xylocaine and advised on physical therapy anti-inflammatories and will be seen back if symptoms indicate and someday 1 the correction of right big toe joint

## 2017-02-02 ENCOUNTER — Telehealth: Payer: Self-pay | Admitting: *Deleted

## 2017-02-02 NOTE — Telephone Encounter (Signed)
Pt states she has a little infected toe and would like an antibiotic. I reviewed clinicals and Dr. Charlsie Merles saw pt 08/2016 but not for a toenail problem. I informed pt she would need to be seen and she states she thinks she has a fungal infection and I told her an antibiotic would not take care of a fungus, it would need to be tested for the organism causing. I told pt I would have scheduler contact tomorrow.

## 2017-02-13 ENCOUNTER — Ambulatory Visit (INDEPENDENT_AMBULATORY_CARE_PROVIDER_SITE_OTHER): Payer: 59 | Admitting: Podiatry

## 2017-02-13 ENCOUNTER — Encounter: Payer: Self-pay | Admitting: Podiatry

## 2017-02-13 DIAGNOSIS — M722 Plantar fascial fibromatosis: Secondary | ICD-10-CM

## 2017-02-13 DIAGNOSIS — B351 Tinea unguium: Secondary | ICD-10-CM | POA: Diagnosis not present

## 2017-02-13 MED ORDER — TERBINAFINE HCL 250 MG PO TABS: ORAL_TABLET | ORAL | 0 refills | Status: DC

## 2017-02-16 NOTE — Progress Notes (Signed)
Subjective:    Patient ID: Sarah Lucas, female   DOB: 41 y.o.   MRN: [id number]   HPI patient presents with discoloration and slight thickness the left second nail and is concerned about fungus    ROS      Objective:  Physical Exam Neurovascular status intact muscle strength adequate with discoloration second nail left that's localized in nature with no proximal spread and slight change in other nails    Assessment:   Combination of mycotic nail with probability for trauma left second nail due to activity to be level     Plan:     H&P condition reviewed and education rendered to patient. She wants and aggressive conservative approach and I've recommended laser therapy along with oral antifungal to be done of a pulse tight nature. Patient is written prescription and it is sent to her pharmacy and is scheduled for laser treatment of left second nail

## 2017-02-27 ENCOUNTER — Ambulatory Visit (INDEPENDENT_AMBULATORY_CARE_PROVIDER_SITE_OTHER): Payer: Self-pay

## 2017-02-27 DIAGNOSIS — R52 Pain, unspecified: Secondary | ICD-10-CM

## 2017-02-27 DIAGNOSIS — B351 Tinea unguium: Secondary | ICD-10-CM

## 2017-03-03 NOTE — Progress Notes (Signed)
Pt presents with mycotic infection of nails  All other systems are negative  Laser therapy administered to affected nails and tolerated well. All safety precautions were in place. Re-appinted in 4 weeks for 2nd treatment

## 2017-04-06 ENCOUNTER — Ambulatory Visit: Payer: 59

## 2017-04-10 ENCOUNTER — Ambulatory Visit: Payer: Self-pay | Admitting: Podiatry

## 2017-04-10 DIAGNOSIS — B351 Tinea unguium: Secondary | ICD-10-CM

## 2017-05-06 NOTE — Progress Notes (Signed)
Pt presents with mycotic infection of nails  All other systems are negative  Laser therapy administered to affected nails and tolerated well. All safety precautions were in place. Re-appinted in 4 weeks for 3rd treatment

## 2017-12-24 ENCOUNTER — Other Ambulatory Visit: Payer: Self-pay | Admitting: Gynecology

## 2017-12-24 DIAGNOSIS — N6489 Other specified disorders of breast: Secondary | ICD-10-CM

## 2017-12-28 ENCOUNTER — Ambulatory Visit
Admission: RE | Admit: 2017-12-28 | Discharge: 2017-12-28 | Disposition: A | Discharge status: Home / Self Care | Payer: 59 | Source: Ambulatory Visit | Attending: Gynecology | Admitting: Gynecology

## 2017-12-28 ENCOUNTER — Ambulatory Visit: Payer: Self-pay

## 2017-12-28 DIAGNOSIS — N6489 Other specified disorders of breast: Secondary | ICD-10-CM

## 2019-07-12 ENCOUNTER — Ambulatory Visit (INDEPENDENT_AMBULATORY_CARE_PROVIDER_SITE_OTHER): Payer: 59 | Admitting: Podiatry

## 2019-07-12 ENCOUNTER — Other Ambulatory Visit: Payer: Self-pay

## 2019-07-12 ENCOUNTER — Ambulatory Visit (INDEPENDENT_AMBULATORY_CARE_PROVIDER_SITE_OTHER): Payer: 59

## 2019-07-12 ENCOUNTER — Encounter: Payer: Self-pay | Admitting: Podiatry

## 2019-07-12 DIAGNOSIS — M2011 Hallux valgus (acquired), right foot: Secondary | ICD-10-CM

## 2019-07-12 DIAGNOSIS — M779 Enthesopathy, unspecified: Secondary | ICD-10-CM

## 2019-07-12 DIAGNOSIS — M2012 Hallux valgus (acquired), left foot: Secondary | ICD-10-CM

## 2019-07-12 DIAGNOSIS — M722 Plantar fascial fibromatosis: Secondary | ICD-10-CM | POA: Diagnosis not present

## 2019-07-12 NOTE — Progress Notes (Signed)
Subjective:   Patient ID: Sarah Lucas, female   DOB: 43 y.o.   MRN: [id number]   HPI Patient states my right heel has become very sore and makes it hard for me to be active and I am having throat discomfort for the left foot and the metatarsals and I know I need to get this right bunion fixed   ROS      Objective:  Physical Exam  Neurovascular status intact with patient found to have acute inflammation right plantar fascial insertional point tendon calcaneus mild inflammation of the lesser MPJs left and moderate structural bunion deformity left with correction of the right one which did very well for     Assessment:  Acute plantar fasciitis right with inflammation fluid buildup along with low-grade capsulitis left and bunion deformity right     Plan:  H&P x-rays reviewed all conditions discussed and today I did sterile prep injected the plantar fascial right 3 mg Kenalog 5 mg Xylocaine applied fascial brace to give support instructed on shoe gear modifications and possible orthotics to take pressure off the left foot.  Discussed capsulitis and explained its relationship to previous bunion correction and discussed bunion deformity right  X-rays indicate spur formation plantar heel right with well corrected bunion deformity left and structural bunion right with moderate hallux limitus symptomatology

## 2019-07-12 NOTE — Patient Instructions (Signed)

## 2019-07-27 ENCOUNTER — Ambulatory Visit (INDEPENDENT_AMBULATORY_CARE_PROVIDER_SITE_OTHER): Payer: 59 | Admitting: Podiatry

## 2019-07-27 ENCOUNTER — Other Ambulatory Visit: Payer: Self-pay

## 2019-07-27 ENCOUNTER — Encounter: Payer: Self-pay | Admitting: Podiatry

## 2019-07-27 DIAGNOSIS — M779 Enthesopathy, unspecified: Secondary | ICD-10-CM | POA: Diagnosis not present

## 2019-07-27 DIAGNOSIS — M722 Plantar fascial fibromatosis: Secondary | ICD-10-CM | POA: Diagnosis not present

## 2019-07-31 NOTE — Progress Notes (Signed)
Subjective:   Patient ID: Sarah Lucas, female   DOB: 43 y.o.   MRN: [id number]   HPI Patient states the right heel is still hurting in the left big toe joint is getting problems and has flared up recently   ROS      Objective:  Physical Exam  Neurovascular status intact with exquisite discomfort plantar aspect 3 right heel insertional point tendon calcaneus with inflammation fluid buildup and inflammation around the first MPJ left     Assessment:  Continue plantar fasciitis right with capsulitis left     Plan:  Reviewed both conditions sterile prep injected the fascial right 3 mg Kenalog 5 mg Xylocaine for the left sterile prep and injected her periarticular around the joint 3 mg Dexasone Kenalog 5 mg Xylocaine and reappoint 3 weeks and ultimately will require surgery but she needs to finish furniture market

## 2019-08-17 ENCOUNTER — Ambulatory Visit: Payer: 59 | Admitting: Podiatry

## 2019-08-29 ENCOUNTER — Encounter: Payer: Self-pay | Admitting: Podiatry

## 2019-08-29 ENCOUNTER — Ambulatory Visit (INDEPENDENT_AMBULATORY_CARE_PROVIDER_SITE_OTHER): Payer: 59 | Admitting: Podiatry

## 2019-08-29 ENCOUNTER — Other Ambulatory Visit: Payer: Self-pay

## 2019-08-29 DIAGNOSIS — M2012 Hallux valgus (acquired), left foot: Secondary | ICD-10-CM

## 2019-08-29 DIAGNOSIS — M779 Enthesopathy, unspecified: Secondary | ICD-10-CM

## 2019-08-29 DIAGNOSIS — M2011 Hallux valgus (acquired), right foot: Secondary | ICD-10-CM

## 2019-08-31 NOTE — Progress Notes (Signed)
Subjective:   Patient ID: Sarah Lucas, female   DOB: 43 y.o.   MRN: [id number]   HPI Patient states that the right foot seems to be improved in the heel but the left big toe joint can still be bothersome for her and also she knows at 1 point she needs to get the right big joint fixed   ROS      Objective:  Physical Exam  Neurovascular status intact with continued moderate discomfort of the inner phalangeal joint left hallux and into the MPJ with no restrictions of motion or indications of crepitus or structural changes.  The right shows diminished range of motion with structural bunion spur formation noted     Assessment:  Still getting some capsular like inflammation of the left first MPJ inner phalangeal joint and the right heel is improving.  There is also mild discomfort of the MPJ left but that is low-grade     Plan:  Doing well with mild fasciitis symptoms right that continue to improve with inflammation around the first MPJ left that continues to be present but does not appear to be structural abnormalities.  Most likely functional secondary to foot structure new  Reviewed this with patient explaining to her the issues.  At this point we will get a go ahead and casted for functional orthotics with reverse Morton's extension to try to reduce stress and this was done by ped orthotist.  Patient will be seen back to recheck

## 2019-09-26 ENCOUNTER — Other Ambulatory Visit: Payer: 59 | Admitting: Orthotics

## 2019-09-27 ENCOUNTER — Other Ambulatory Visit: Payer: Self-pay

## 2019-09-27 DIAGNOSIS — Z20822 Contact with and (suspected) exposure to covid-19: Secondary | ICD-10-CM

## 2019-09-29 LAB — NOVEL CORONAVIRUS, NAA: SARS-CoV-2, NAA: DETECTED — AB

## 2019-10-28 ENCOUNTER — Ambulatory Visit: Payer: 59 | Admitting: Internal Medicine

## 2019-10-31 ENCOUNTER — Telehealth: Payer: Self-pay | Admitting: Internal Medicine

## 2019-10-31 NOTE — Telephone Encounter (Signed)
Jessica from [facility] is calling in regards to patient being scheduled incorrectly and listed as a "no show". Patient was mistaken for another patient.

## 2019-10-31 NOTE — Telephone Encounter (Signed)
Patient needs to be removed from [facility] system. Per Shanda Bumps from [facility], pt did not need cardiology referral

## 2019-11-03 ENCOUNTER — Telehealth: Payer: Self-pay | Admitting: Podiatry

## 2019-11-03 NOTE — Telephone Encounter (Signed)
Pt left message returning call to get appt to pick up orthotics.   Returned call and left message for pt to call to get appt scheduled. Next available is next week

## 2019-11-10 ENCOUNTER — Ambulatory Visit: Payer: 59 | Admitting: Orthotics

## 2019-11-10 ENCOUNTER — Other Ambulatory Visit: Payer: Self-pay

## 2019-11-10 DIAGNOSIS — M2011 Hallux valgus (acquired), right foot: Secondary | ICD-10-CM

## 2019-11-10 DIAGNOSIS — M722 Plantar fascial fibromatosis: Secondary | ICD-10-CM

## 2019-11-10 DIAGNOSIS — M779 Enthesopathy, unspecified: Secondary | ICD-10-CM

## 2019-11-10 NOTE — Progress Notes (Signed)
Patient came in today to p/up functional foot orthotics.   The orthotics were assessed to both fit and function.  The F/O addressed the biomechanical issues/pathologies as intended, offering good longitudinal arch support, proper offloading, and foot support. There weren't any signs of discomfort or irritation.  The F/O fit properly in footwear with minimal trimming/adjustments. 

## 2020-01-11 ENCOUNTER — Other Ambulatory Visit: Payer: Self-pay | Admitting: Obstetrics and Gynecology

## 2020-01-11 DIAGNOSIS — R928 Other abnormal and inconclusive findings on diagnostic imaging of breast: Secondary | ICD-10-CM

## 2020-01-27 ENCOUNTER — Other Ambulatory Visit: Payer: Self-pay

## 2020-01-27 ENCOUNTER — Ambulatory Visit: Payer: 59

## 2020-01-27 ENCOUNTER — Ambulatory Visit
Admission: RE | Admit: 2020-01-27 | Discharge: 2020-01-27 | Disposition: A | Discharge status: Home / Self Care | Payer: 59 | Source: Ambulatory Visit | Attending: Obstetrics and Gynecology | Admitting: Obstetrics and Gynecology

## 2020-01-27 DIAGNOSIS — R928 Other abnormal and inconclusive findings on diagnostic imaging of breast: Secondary | ICD-10-CM

## 2020-11-22 ENCOUNTER — Other Ambulatory Visit: Payer: Self-pay

## 2020-11-22 ENCOUNTER — Encounter: Payer: Self-pay | Admitting: Podiatry

## 2020-11-22 ENCOUNTER — Ambulatory Visit (INDEPENDENT_AMBULATORY_CARE_PROVIDER_SITE_OTHER): Payer: BC Managed Care – PPO | Admitting: Podiatry

## 2020-11-22 ENCOUNTER — Ambulatory Visit (INDEPENDENT_AMBULATORY_CARE_PROVIDER_SITE_OTHER): Payer: BC Managed Care – PPO

## 2020-11-22 DIAGNOSIS — M779 Enthesopathy, unspecified: Secondary | ICD-10-CM

## 2020-11-22 MED ORDER — TRIAMCINOLONE ACETONIDE 10 MG/ML IJ SUSP
10.0000 mg | Freq: Once | INTRAMUSCULAR | Status: AC
  Administered 2020-11-22: 10 mg

## 2020-11-23 NOTE — Progress Notes (Signed)
Subjective:   Patient ID: Sarah Lucas, female   DOB: 45 y.o.   MRN: [id number]   HPI Patient states she developed a lot of pain in her left forefoot and does not remember specific injury.  States it is gotten worse over the last 6 months but worse over the last 2   ROS      Objective:  Physical Exam  Neurovascular status intact with patient found to have quite a bit of inflammation second MPJ left with moderate elevation and slight medial dislocation of the second metatarsal phalangeal joint     Assessment:  Probability for flexor plate dislocation or stretch with possible tear second MPJ left     Plan:  H&P reviewed condition x-ray today I did a sterile prep of the area after anesthesia application 60 mg like Marcaine mixture I then aspirated the second MPJ getting out a small amount of clear fluid injected quarter cc dexamethasone Kenalog applied padding to reduce pressure on the joint reappoint to recheck  X-rays indicate that there is slight medial and dorsal dislocation digit to left at the MPJ

## 2020-12-06 ENCOUNTER — Encounter: Payer: Self-pay | Admitting: Podiatry

## 2020-12-06 ENCOUNTER — Ambulatory Visit: Payer: BC Managed Care – PPO | Admitting: Podiatry

## 2020-12-06 ENCOUNTER — Other Ambulatory Visit: Payer: Self-pay

## 2020-12-06 DIAGNOSIS — M2042 Other hammer toe(s) (acquired), left foot: Secondary | ICD-10-CM | POA: Diagnosis not present

## 2020-12-06 DIAGNOSIS — M779 Enthesopathy, unspecified: Secondary | ICD-10-CM | POA: Diagnosis not present

## 2020-12-09 NOTE — Progress Notes (Signed)
Subjective:   Patient ID: Sarah Lucas, female   DOB: 45 y.o.   MRN: [id number]   HPI Please well including she still getting pain she knows that she is getting needs but she cannot do it for the foreseeable future.  Patient has furniture market coming up   ROS      Objective:  Physical Exam  Neurovascular status intact with rigid painful second MPJ left with moderate elevation of the toe consistent with deformity     Assessment:  Inflammatory capsulitis with probable flexor plate disruption second MPJ left     Plan:  H&P reviewed condition discussed possibility for surgical intervention with shortening osteotomy and patient know she will need this done but cannot do now and will use rigid shoes boot and padding until that time.  Discussed orthotics as possibility short-term

## 2021-01-10 ENCOUNTER — Ambulatory Visit: Payer: BC Managed Care – PPO | Admitting: Podiatry

## 2021-01-16 ENCOUNTER — Ambulatory Visit: Payer: BC Managed Care – PPO | Admitting: Podiatry

## 2021-01-16 ENCOUNTER — Other Ambulatory Visit: Payer: Self-pay

## 2021-01-16 DIAGNOSIS — M778 Other enthesopathies, not elsewhere classified: Secondary | ICD-10-CM | POA: Diagnosis not present

## 2021-01-17 DIAGNOSIS — M778 Other enthesopathies, not elsewhere classified: Secondary | ICD-10-CM | POA: Diagnosis not present

## 2021-01-17 MED ORDER — TRIAMCINOLONE ACETONIDE 10 MG/ML IJ SUSP
10.0000 mg | Freq: Once | INTRAMUSCULAR | Status: AC
  Administered 2021-01-17: 10 mg

## 2021-01-17 NOTE — Progress Notes (Signed)
Subjective:   Patient ID: Sarah Lucas, female   DOB: 45 y.o.   MRN: [id number]   HPI Patient presents stating that she is getting ready for furniture market she knows that she may end up needing surgery but she is desperate for some relief before marked   ROS      Objective:  Physical Exam  Neurovascular status intact with inflammation second MPJ left with mild deviation of the digit     Assessment:  Probability for capsulitis with possibility related to flexor plate dislocation      Plan:  H&P condition reviewed I did a proximal nerve block of the area aspirated the joint getting out a small amount of clear fluid injected quarter cc dexamethasone Kenalog advised on rigid bottom shoe gear usage and reappoint to recheck depending on response and may ultimately require surgical intervention

## 2021-07-23 IMAGING — MG MM DIGITAL DIAGNOSTIC UNILAT*L* W/ TOMO W/ CAD
4 series · 4 of 12 positions shown · non-contrast
Comparison: 01/03/2020 and earlier

CLINICAL DATA: Patient returns after screening study for evaluation
of possible LEFT breast asymmetry.

EXAM:
DIGITAL DIAGNOSTIC UNILATERAL LEFT MAMMOGRAM WITH CAD AND TOMO

[L ML synth-2D]
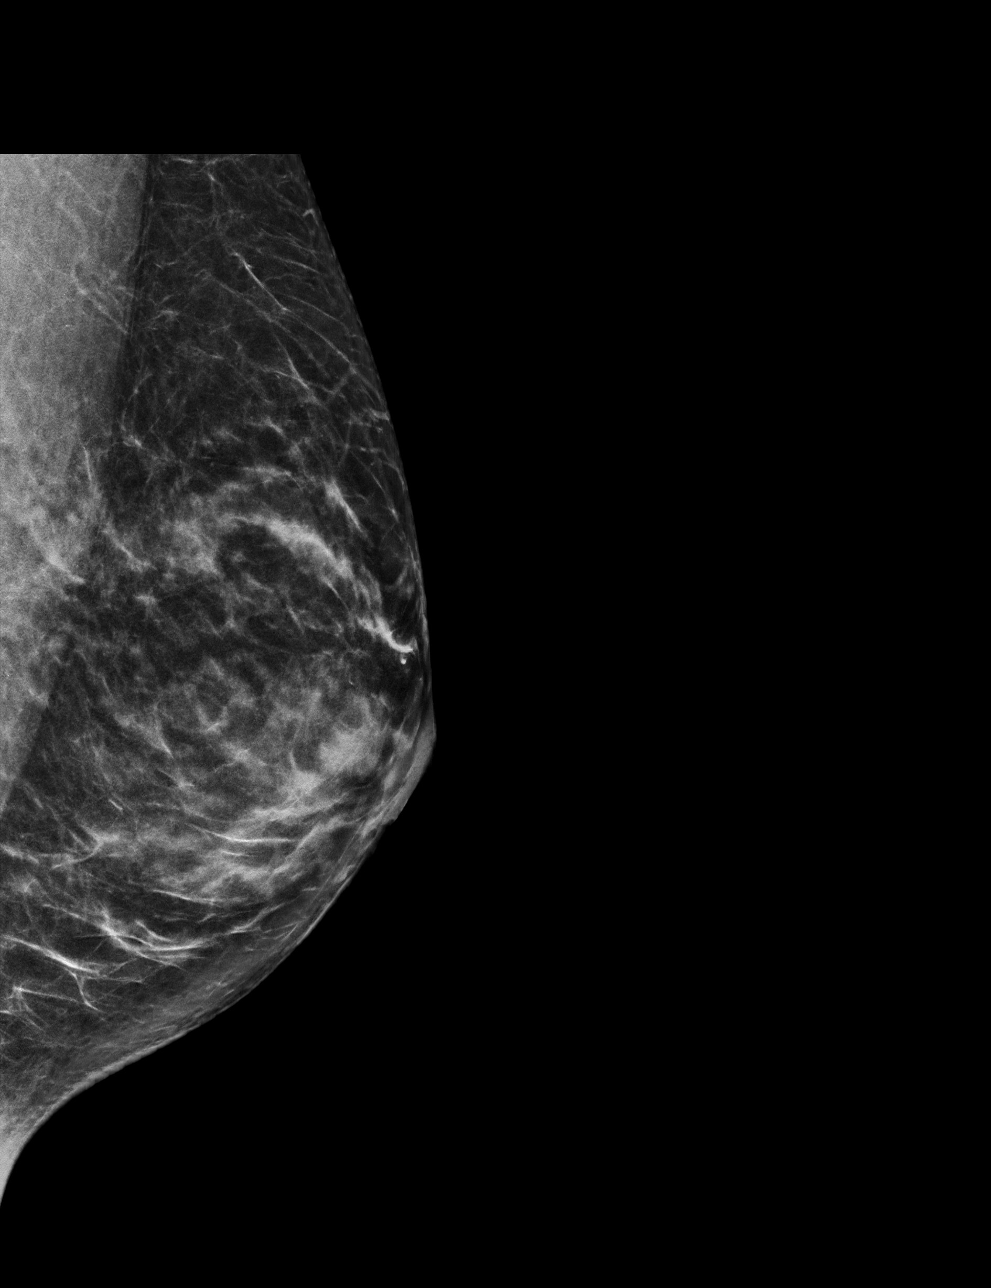

[L CC synth-2D]
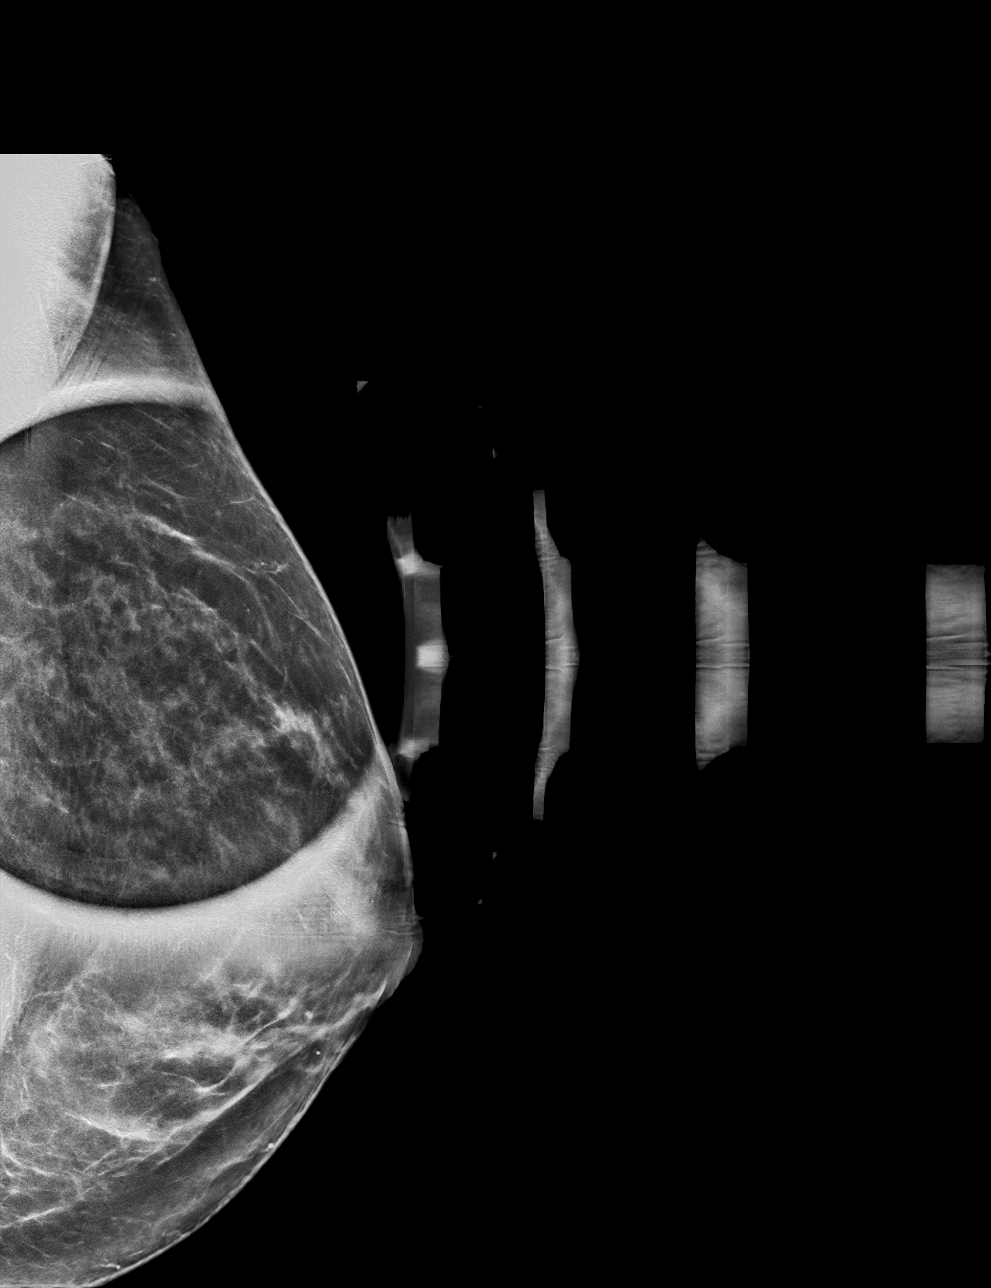

[L ML tomo · tomo slice 33/64.0]
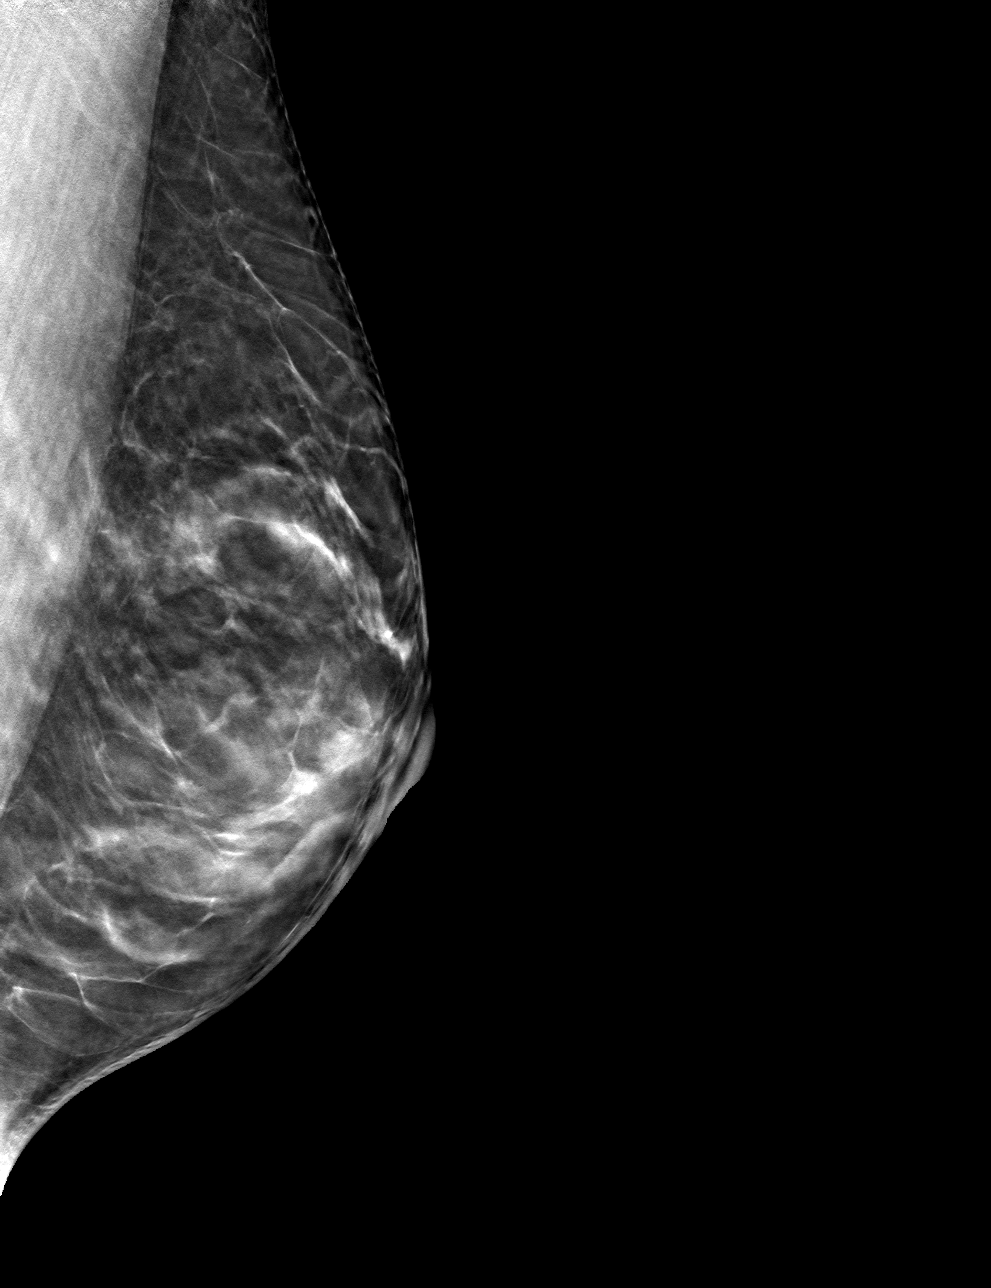

[L CC tomo · tomo slice 30/59.0]
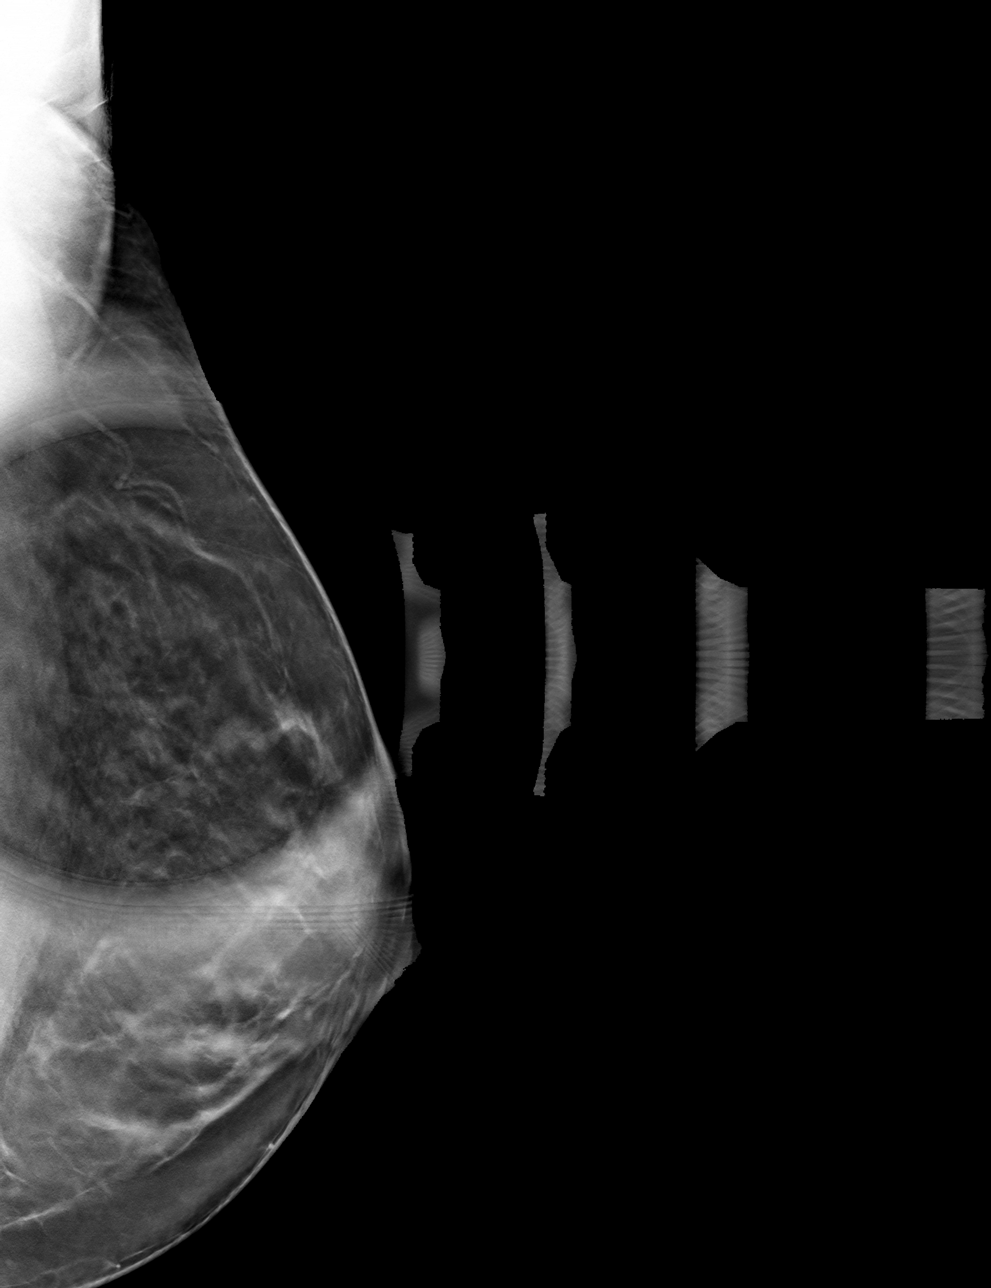

[4 of 12 positions shown; findings below may reference images not displayed]

ACR Breast Density Category c: The breast tissue is heterogeneously
dense, which may obscure small masses.
FINDINGS: Additional 2-D and 3-D images are performed. These views demonstrate
no persistent mass or asymmetry in the LATERAL portion of the LEFT
breast. No suspicious mass, distortion, or microcalcifications are
identified to suggest presence of malignancy.

Mammographic images were processed with CAD.
IMPRESSION: No mammographic evidence for malignancy.

RECOMMENDATION:
Screening mammogram in one year.(Code:[id number])

I have discussed the findings and recommendations with the patient.
If applicable, a reminder letter will be sent to the patient
regarding the next appointment.

BI-RADS CATEGORY  1: Negative.

## 2023-03-23 ENCOUNTER — Other Ambulatory Visit: Payer: Self-pay | Admitting: Obstetrics and Gynecology

## 2023-03-23 DIAGNOSIS — R928 Other abnormal and inconclusive findings on diagnostic imaging of breast: Secondary | ICD-10-CM

## 2023-04-29 ENCOUNTER — Other Ambulatory Visit: Payer: Self-pay | Admitting: Obstetrics and Gynecology

## 2023-04-29 ENCOUNTER — Ambulatory Visit
Admission: RE | Admit: 2023-04-29 | Discharge: 2023-04-29 | Disposition: A | Discharge status: Home / Self Care | Payer: BC Managed Care – PPO | Source: Ambulatory Visit | Attending: Obstetrics and Gynecology | Admitting: Obstetrics and Gynecology

## 2023-04-29 ENCOUNTER — Encounter: Payer: Self-pay | Admitting: Obstetrics and Gynecology

## 2023-04-29 DIAGNOSIS — R928 Other abnormal and inconclusive findings on diagnostic imaging of breast: Secondary | ICD-10-CM

## 2023-04-29 DIAGNOSIS — N6489 Other specified disorders of breast: Secondary | ICD-10-CM

## 2024-04-14 ENCOUNTER — Other Ambulatory Visit: Payer: Self-pay | Admitting: Obstetrics and Gynecology

## 2024-04-14 ENCOUNTER — Encounter: Payer: Self-pay | Admitting: Obstetrics and Gynecology

## 2024-04-14 DIAGNOSIS — N6489 Other specified disorders of breast: Secondary | ICD-10-CM

## 2024-05-04 ENCOUNTER — Ambulatory Visit
Admission: RE | Admit: 2024-05-04 | Discharge: 2024-05-04 | Disposition: A | Discharge status: Home / Self Care | Source: Ambulatory Visit | Attending: Obstetrics and Gynecology | Admitting: Obstetrics and Gynecology

## 2024-05-04 ENCOUNTER — Other Ambulatory Visit: Payer: Self-pay | Admitting: Obstetrics and Gynecology

## 2024-05-04 DIAGNOSIS — N6489 Other specified disorders of breast: Secondary | ICD-10-CM
